# Patient Record
Sex: Female | Born: 1975 | Race: Black or African American | Hispanic: No | Smoking: Former smoker
Health system: Southern US, Community
[De-identification: ages and names within clinical notes are randomized; demographics above are authoritative.]

## PROBLEM LIST (undated history)

## (undated) DIAGNOSIS — IMO0002 Reserved for concepts with insufficient information to code with codable children: Secondary | ICD-10-CM

## (undated) DIAGNOSIS — F329 Major depressive disorder, single episode, unspecified: Secondary | ICD-10-CM

## (undated) DIAGNOSIS — Z789 Other specified health status: Secondary | ICD-10-CM

## (undated) DIAGNOSIS — D649 Anemia, unspecified: Secondary | ICD-10-CM

## (undated) DIAGNOSIS — A749 Chlamydial infection, unspecified: Secondary | ICD-10-CM

## (undated) DIAGNOSIS — F32A Depression, unspecified: Secondary | ICD-10-CM

## (undated) DIAGNOSIS — N760 Acute vaginitis: Secondary | ICD-10-CM

## (undated) DIAGNOSIS — B9689 Other specified bacterial agents as the cause of diseases classified elsewhere: Secondary | ICD-10-CM

## (undated) DIAGNOSIS — R87619 Unspecified abnormal cytological findings in specimens from cervix uteri: Secondary | ICD-10-CM

## (undated) HISTORY — PX: NO PAST SURGERIES: SHX2092

---

## 2007-03-11 ENCOUNTER — Ambulatory Visit (HOSPITAL_COMMUNITY): Admission: RE | Admit: 2007-03-11 | Discharge: 2007-03-11 | Payer: Self-pay | Admitting: Family Medicine

## 2007-03-20 ENCOUNTER — Emergency Department (HOSPITAL_COMMUNITY): Admission: EM | Admit: 2007-03-20 | Discharge: 2007-03-20 | Payer: Self-pay | Admitting: Emergency Medicine

## 2007-05-06 ENCOUNTER — Ambulatory Visit: Payer: Self-pay | Admitting: Physician Assistant

## 2007-05-06 ENCOUNTER — Inpatient Hospital Stay (HOSPITAL_COMMUNITY): Admission: AD | Admit: 2007-05-06 | Discharge: 2007-05-08 | Payer: Self-pay | Admitting: Obstetrics & Gynecology

## 2008-05-26 ENCOUNTER — Inpatient Hospital Stay (HOSPITAL_COMMUNITY): Admission: AD | Admit: 2008-05-26 | Discharge: 2008-05-28 | Payer: Self-pay | Admitting: Obstetrics and Gynecology

## 2008-05-26 ENCOUNTER — Ambulatory Visit: Payer: Self-pay | Admitting: Advanced Practice Midwife

## 2008-11-08 IMAGING — US US OB COMP +14 WK
1 series · 2 of 2 positions shown · non-contrast
Comparison: none

OBSTETRICAL ULTRASOUND:

 This ultrasound exam was performed in the [HOSPITAL] Ultrasound Department.  The OB US report was generated in the AS system, and faxed to the ordering physician.  This report is also available in [REDACTED] PACS.

[Series 1: us ob comp +14 wk · 0.30mm/px · 2 of 2 slices shown]
[im 1/2]
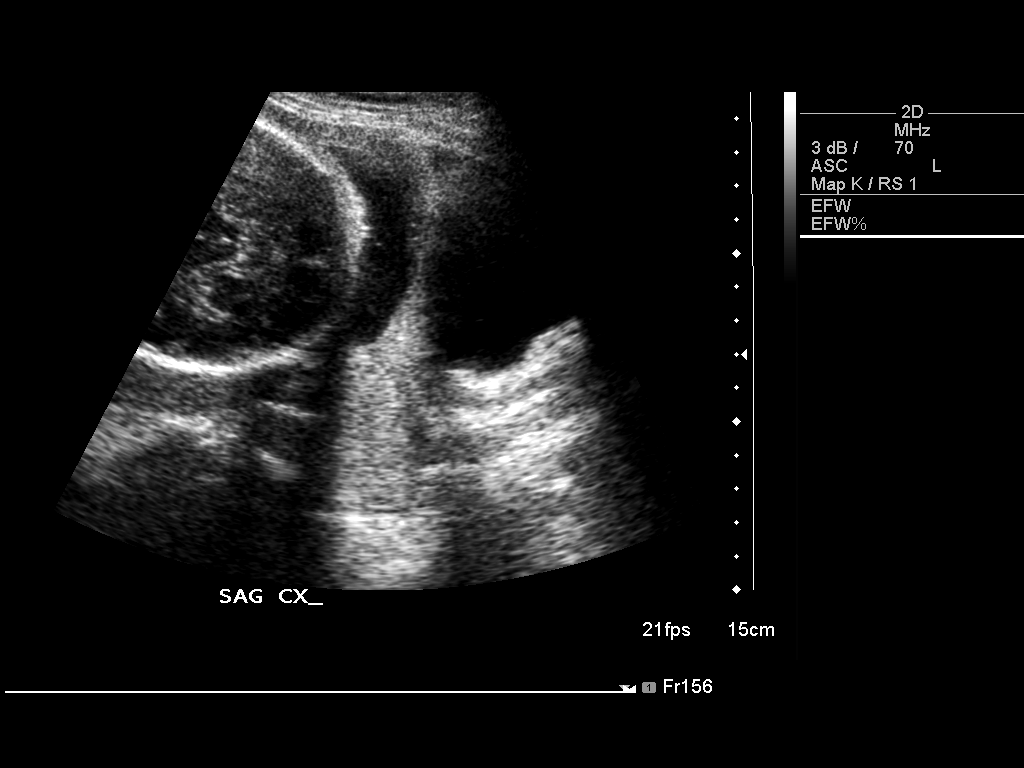
[im 2/2]
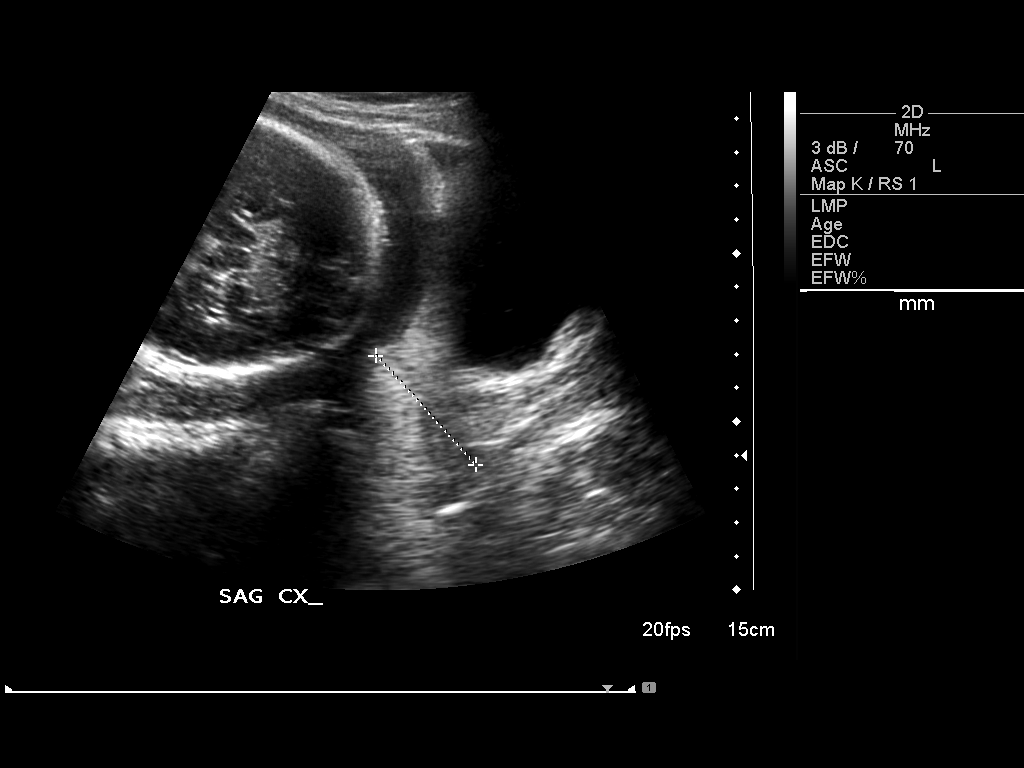

[2 of 2 positions shown; findings below may reference images not displayed]

IMPRESSION: See AS Obstetric US report.

## 2010-04-27 NOTE — L&D Delivery Note (Cosign Needed)
Delivery Note At 12:12 PM a viable female was delivered via Vaginal, Spontaneous Delivery (Presentation: ; Occiput Anterior).  APGAR: 9, 9; weight 6 lb 3.1 oz (2809 g).   Placenta status: Intact, Spontaneous.  Cord: 3 vessels with the following complications: None.    Anesthesia: Epidural  Episiotomy: None Lacerations: None Suture Repair: n/a Est. Blood Loss (mL): 250  Mom to postpartum.  Baby to nursery-stable.  Maren Reamer, Janautica Netzley 04/14/2011, 12:36 PM

## 2010-08-11 LAB — STREP B DNA PROBE: Strep Group B Ag: NEGATIVE

## 2010-08-11 LAB — WET PREP, GENITAL
Clue Cells Wet Prep HPF POC: NONE SEEN
Trich, Wet Prep: NONE SEEN

## 2010-08-11 LAB — URINALYSIS, ROUTINE W REFLEX MICROSCOPIC
Bilirubin Urine: NEGATIVE
Glucose, UA: NEGATIVE mg/dL
Hgb urine dipstick: NEGATIVE
Ketones, ur: NEGATIVE mg/dL
Nitrite: NEGATIVE
Protein, ur: NEGATIVE mg/dL
Urobilinogen, UA: 0.2 mg/dL (ref 0.0–1.0)
pH: 6.5 (ref 5.0–8.0)

## 2010-08-11 LAB — RUBELLA SCREEN: Rubella: 99 IU/mL — ABNORMAL HIGH

## 2010-08-11 LAB — GC/CHLAMYDIA PROBE AMP, GENITAL
Chlamydia, DNA Probe: POSITIVE — AB
GC Probe Amp, Genital: NEGATIVE

## 2010-08-11 LAB — RAPID URINE DRUG SCREEN, HOSP PERFORMED
Amphetamines: NOT DETECTED
Barbiturates: NOT DETECTED
Benzodiazepines: NOT DETECTED
Cocaine: NOT DETECTED

## 2010-08-11 LAB — URINE MICROSCOPIC-ADD ON

## 2010-08-11 LAB — CBC: RBC: 4.27 MIL/uL (ref 3.87–5.11)

## 2010-08-11 LAB — RAPID HIV SCREEN (WH-MAU): Rapid HIV Screen: NONREACTIVE

## 2011-01-15 LAB — URIC ACID: Uric Acid, Serum: 5.3

## 2011-01-15 LAB — CBC
Hemoglobin: 12.2
RBC: 4.17

## 2011-01-24 ENCOUNTER — Inpatient Hospital Stay (HOSPITAL_COMMUNITY)
Admission: AD | Admit: 2011-01-24 | Discharge: 2011-01-24 | Disposition: A | Discharge status: Home / Self Care | Payer: Self-pay | Source: Ambulatory Visit | Attending: Obstetrics & Gynecology | Admitting: Obstetrics & Gynecology

## 2011-01-24 ENCOUNTER — Other Ambulatory Visit: Payer: Self-pay | Admitting: Emergency Medicine

## 2011-01-24 ENCOUNTER — Encounter (HOSPITAL_COMMUNITY): Payer: Self-pay | Admitting: *Deleted

## 2011-01-24 DIAGNOSIS — B9689 Other specified bacterial agents as the cause of diseases classified elsewhere: Secondary | ICD-10-CM

## 2011-01-24 DIAGNOSIS — N760 Acute vaginitis: Secondary | ICD-10-CM | POA: Insufficient documentation

## 2011-01-24 DIAGNOSIS — A499 Bacterial infection, unspecified: Secondary | ICD-10-CM | POA: Insufficient documentation

## 2011-01-24 DIAGNOSIS — Z3480 Encounter for supervision of other normal pregnancy, unspecified trimester: Secondary | ICD-10-CM

## 2011-01-24 DIAGNOSIS — O2390 Unspecified genitourinary tract infection in pregnancy, unspecified trimester: Secondary | ICD-10-CM | POA: Insufficient documentation

## 2011-01-24 DIAGNOSIS — A749 Chlamydial infection, unspecified: Secondary | ICD-10-CM

## 2011-01-24 DIAGNOSIS — O4700 False labor before 37 completed weeks of gestation, unspecified trimester: Secondary | ICD-10-CM | POA: Insufficient documentation

## 2011-01-24 HISTORY — DX: Chlamydial infection, unspecified: A74.9

## 2011-01-24 HISTORY — DX: Other specified bacterial agents as the cause of diseases classified elsewhere: B96.89

## 2011-01-24 HISTORY — DX: Other specified health status: Z78.9

## 2011-01-24 HISTORY — DX: Other specified bacterial agents as the cause of diseases classified elsewhere: N76.0

## 2011-01-24 LAB — URINE MICROSCOPIC-ADD ON

## 2011-01-24 LAB — URINALYSIS, ROUTINE W REFLEX MICROSCOPIC
Bilirubin Urine: NEGATIVE
Hgb urine dipstick: NEGATIVE
Ketones, ur: >80 mg/dL — AB
Protein, ur: NEGATIVE mg/dL
Specific Gravity, Urine: 1.02 (ref 1.005–1.030)

## 2011-01-24 LAB — WET PREP, GENITAL: Yeast Wet Prep HPF POC: NONE SEEN

## 2011-01-24 MED ORDER — METRONIDAZOLE 500 MG PO TABS: 500.0000 mg | ORAL_TABLET | Freq: Two times a day (BID) | ORAL | Status: AC

## 2011-01-24 NOTE — Progress Notes (Signed)
Dr Elwyn Reach notified of pt's admission and status. Will seee pt in MAU

## 2011-01-24 NOTE — Progress Notes (Signed)
Dr Elwyn Reach in to see pt. Spec exam done and wet prep and GC/Chlam obtained. Pt tol well

## 2011-01-24 NOTE — Progress Notes (Signed)
Pt states, " I started having pressure in my low abdomen and low back at 9 pm. I went in to work at 10 pm, but they steadily got worse and sometimes it felt ljke contractions."

## 2011-01-24 NOTE — Progress Notes (Signed)
Written and verbal d/c instructions given and understanding voiced. Wants to try to get appt with private MD. List of OB Mds given. To call [facility] Monday for appt since started with [facility] until finds out if priv. MD will see her this late in preg.

## 2011-01-24 NOTE — ED Provider Notes (Signed)
Agree 

## 2011-01-24 NOTE — Progress Notes (Signed)
Dr Elwyn Reach in to see pt and discuss lab results.

## 2011-01-24 NOTE — Progress Notes (Signed)
G9P8 at ?30.2wks. Pt states has had 2 Prenatal visits to health dept. Last evening started having pain in lower abd. Like balling up in abdomen. Went on to work and pain got worse. Having some pain RLQ which is worse with walking. Having constant menstrual like cramps in lower back.

## 2011-01-24 NOTE — ED Provider Notes (Signed)
History     Chief Complaint  Patient presents with  . Contractions  . Back Pain   HPI Ms. Gutterman is a 35 year old [id number] at [redacted]w[redacted]d by LMP who presents for contractions and back pain.  The pain started around 9pm and has been getting more constant since then.  Currently feels some crampy abdominal pain and occasionally tightening.  Also complaining of constant back pain.  Denies loss of fluid and vaginal bleeding.  Reports good fetal movement.  Denies dysuria, vaginal discharge, vaginal itching. Reports good PO intake today.  Did state had a yeast infection 2 weeks ago that she treated with an OTC cream.  Has seen the HD a few times during this pregnancy, but having difficultly scheduling appointments.  States has not had any blood work or ultrasounds.  Denies any problems with the pregnancy.  OB History    Grav Para Term Preterm Abortions TAB SAB Ect Mult Living   9 8 6 2  0 0 0 0 0 7      Past Medical History  Diagnosis Date  . No pertinent past medical history     Past Surgical History  Procedure Date  . No past surgeries     No family history on file. No history of HTN or DM.  History  Substance Use Topics  . Smoking status: Never Smoker   . Smokeless tobacco: Not on file  . Alcohol Use:      smoked marijuana 2 wks ago    Allergies: No Known Allergies  Prescriptions prior to admission  Medication Sig Dispense Refill  . acetaminophen (TYLENOL) 500 MG tablet Take 500 mg by mouth every 6 (six) hours as needed. For pain       . Calcium Carbonate Antacid (TUMS PO) Take 1 tablet by mouth daily.        . prenatal vitamin w/FE, FA (PRENATAL 1 + 1) 27-1 MG TABS Take 1 tablet by mouth daily.          Review of Systems  Constitutional: Negative for fever and chills.  Eyes: Negative for blurred vision.  Respiratory: Negative for shortness of breath.   Cardiovascular: Negative for palpitations.  Gastrointestinal: Positive for abdominal pain (see HPI). Negative for nausea,  vomiting, diarrhea and constipation.  Genitourinary: Negative for dysuria.  Skin: Negative for rash.  Neurological: Negative for dizziness and headaches.   Physical Exam   Blood pressure 122/78, pulse 93, temperature 98.5 F (36.9 C), temperature source Oral, height 5\' 4"  (1.626 m), weight 180 lb (81.647 kg), last menstrual period 06/26/2010.  Physical Exam  Constitutional: She is oriented to person, place, and time. She appears well-developed and well-nourished. No distress.  HENT:  Head: Normocephalic and atraumatic.  Mouth/Throat: Oropharynx is clear and moist.  Eyes: No scleral icterus.  Neck: Normal range of motion.  Cardiovascular: Normal rate, regular rhythm, normal heart sounds and intact distal pulses.  Exam reveals no gallop.   No murmur heard. Respiratory: Effort normal. She has no wheezes. She has no rales.  GI: Bowel sounds are normal. There is no tenderness.       Gravid, fundus above umbilicus  Genitourinary:       External genitalia normal.  Normal vagina, small amount of yellowish white discharge present. Cervix visualized, appears normal.  Musculoskeletal: She exhibits no edema and no tenderness.  Neurological: She is alert and oriented to person, place, and time.  Skin: Skin is warm and dry. No rash noted.  Psychiatric: She has a  normal mood and affect. Her behavior is normal.  Dilation: Fingertip Effacement (%): Thick Exam by:: Dr Elwyn Reach  [facility]: 140s, minimal to moderate variability, difficult to trace due to fetal movement, no accels or decels noted  MAU Course  Procedures Urine dipstick shows positive for ketones, small LE.  Micro exam: 11-20 WBC's per HPF and contaminated specimen. Wet Prep: moderate clue cells, WBCs GC/Chlamydia collected   Assessment and Plan  35 year old [id number] at 31.5 by LMP with limited prenatal care presenting with braxton hicks contractions -emphasized importance of following up with the health department -ordered an outpatient  ultrasound; pt to call Monday to schedule, [phone] -BV: flagyl 500mg  BID x7 days -discharge home with preterm labor precautions  BOOTH, Sherrine Salberg 01/24/2011, 3:30 AM

## 2011-01-26 LAB — GC/CHLAMYDIA PROBE AMP, GENITAL
Chlamydia, DNA Probe: POSITIVE — AB
GC Probe Amp, Genital: NEGATIVE

## 2011-02-03 LAB — BASIC METABOLIC PANEL
BUN: 4 — ABNORMAL LOW
Calcium: 8.7
Chloride: 104
Creatinine, Ser: 0.47
GFR calc Af Amer: >60

## 2011-02-03 LAB — RAPID URINE DRUG SCREEN, HOSP PERFORMED
Amphetamines: NOT DETECTED
Barbiturates: NOT DETECTED
Benzodiazepines: NOT DETECTED
Tetrahydrocannabinol: POSITIVE — AB

## 2011-02-03 LAB — CBC
MCHC: 33.3
MCV: 87.4
Platelets: 206
WBC: 8.2

## 2011-02-03 LAB — DIFFERENTIAL
Basophils Relative: 0
Eosinophils Absolute: 0.3
Neutro Abs: 6.4
Neutrophils Relative %: 78 — ABNORMAL HIGH

## 2011-02-03 LAB — ETHANOL: Alcohol, Ethyl (B): <5

## 2011-02-11 ENCOUNTER — Other Ambulatory Visit (HOSPITAL_COMMUNITY): Payer: Self-pay | Admitting: Family

## 2011-02-11 DIAGNOSIS — Z3689 Encounter for other specified antenatal screening: Secondary | ICD-10-CM

## 2011-02-11 LAB — ABO/RH: RH Type: POSITIVE

## 2011-02-11 LAB — RPR: RPR: NONREACTIVE

## 2011-02-13 ENCOUNTER — Ambulatory Visit (HOSPITAL_COMMUNITY)
Admission: RE | Admit: 2011-02-13 | Discharge: 2011-02-13 | Disposition: A | Discharge status: Home / Self Care | Payer: Medicaid Other | Source: Ambulatory Visit | Attending: Family | Admitting: Family

## 2011-02-13 DIAGNOSIS — Z3689 Encounter for other specified antenatal screening: Secondary | ICD-10-CM

## 2011-02-13 DIAGNOSIS — O358XX0 Maternal care for other (suspected) fetal abnormality and damage, not applicable or unspecified: Secondary | ICD-10-CM | POA: Insufficient documentation

## 2011-02-13 DIAGNOSIS — Z363 Encounter for antenatal screening for malformations: Secondary | ICD-10-CM | POA: Insufficient documentation

## 2011-02-13 DIAGNOSIS — O0930 Supervision of pregnancy with insufficient antenatal care, unspecified trimester: Secondary | ICD-10-CM | POA: Insufficient documentation

## 2011-02-13 DIAGNOSIS — Z1389 Encounter for screening for other disorder: Secondary | ICD-10-CM | POA: Insufficient documentation

## 2011-03-26 ENCOUNTER — Other Ambulatory Visit (HOSPITAL_COMMUNITY): Payer: Self-pay | Admitting: Physician Assistant

## 2011-03-31 ENCOUNTER — Ambulatory Visit (HOSPITAL_COMMUNITY)
Admission: RE | Admit: 2011-03-31 | Discharge: 2011-03-31 | Disposition: A | Discharge status: Home / Self Care | Payer: Medicaid Other | Source: Ambulatory Visit | Attending: Physician Assistant | Admitting: Physician Assistant

## 2011-03-31 DIAGNOSIS — O0930 Supervision of pregnancy with insufficient antenatal care, unspecified trimester: Secondary | ICD-10-CM | POA: Insufficient documentation

## 2011-03-31 DIAGNOSIS — O3660X0 Maternal care for excessive fetal growth, unspecified trimester, not applicable or unspecified: Secondary | ICD-10-CM | POA: Insufficient documentation

## 2011-04-13 ENCOUNTER — Encounter (HOSPITAL_COMMUNITY): Payer: Self-pay | Admitting: *Deleted

## 2011-04-13 ENCOUNTER — Inpatient Hospital Stay (HOSPITAL_COMMUNITY)
Admission: AD | Admit: 2011-04-13 | Discharge: 2011-04-15 | DRG: 775 | Disposition: A | Discharge status: Home / Self Care | Payer: Medicaid Other | Source: Ambulatory Visit | Attending: Obstetrics & Gynecology | Admitting: Obstetrics & Gynecology

## 2011-04-13 DIAGNOSIS — O99892 Other specified diseases and conditions complicating childbirth: Secondary | ICD-10-CM | POA: Diagnosis present

## 2011-04-13 DIAGNOSIS — O4290 Premature rupture of membranes, unspecified as to length of time between rupture and onset of labor, unspecified weeks of gestation: Principal | ICD-10-CM | POA: Diagnosis present

## 2011-04-13 DIAGNOSIS — O09529 Supervision of elderly multigravida, unspecified trimester: Secondary | ICD-10-CM | POA: Diagnosis present

## 2011-04-13 DIAGNOSIS — Z22330 Carrier of Group B streptococcus: Secondary | ICD-10-CM

## 2011-04-13 HISTORY — DX: Other specified bacterial agents as the cause of diseases classified elsewhere: B96.89

## 2011-04-13 HISTORY — DX: Unspecified abnormal cytological findings in specimens from cervix uteri: R87.619

## 2011-04-13 HISTORY — DX: Acute vaginitis: N76.0

## 2011-04-13 HISTORY — DX: Depression, unspecified: F32.A

## 2011-04-13 HISTORY — DX: Chlamydial infection, unspecified: A74.9

## 2011-04-13 HISTORY — DX: Reserved for concepts with insufficient information to code with codable children: IMO0002

## 2011-04-13 HISTORY — DX: Major depressive disorder, single episode, unspecified: F32.9

## 2011-04-13 HISTORY — DX: Anemia, unspecified: D64.9

## 2011-04-13 NOTE — Progress Notes (Signed)
Pt felt ctx and a little gush of water @ 2215, then one hour later more watery fluid ran down her leg.

## 2011-04-14 ENCOUNTER — Encounter (HOSPITAL_COMMUNITY): Payer: Self-pay | Admitting: *Deleted

## 2011-04-14 ENCOUNTER — Encounter (HOSPITAL_COMMUNITY): Payer: Self-pay | Admitting: Anesthesiology

## 2011-04-14 ENCOUNTER — Inpatient Hospital Stay (HOSPITAL_COMMUNITY): Payer: Medicaid Other | Admitting: Anesthesiology

## 2011-04-14 DIAGNOSIS — O9989 Other specified diseases and conditions complicating pregnancy, childbirth and the puerperium: Secondary | ICD-10-CM

## 2011-04-14 DIAGNOSIS — O4290 Premature rupture of membranes, unspecified as to length of time between rupture and onset of labor, unspecified weeks of gestation: Secondary | ICD-10-CM

## 2011-04-14 DIAGNOSIS — O09529 Supervision of elderly multigravida, unspecified trimester: Secondary | ICD-10-CM

## 2011-04-14 LAB — RAPID URINE DRUG SCREEN, HOSP PERFORMED
Amphetamines: NOT DETECTED
Barbiturates: NOT DETECTED
Benzodiazepines: NOT DETECTED
Cocaine: NOT DETECTED

## 2011-04-14 LAB — CBC
HCT: 33.8 % — ABNORMAL LOW (ref 36.0–46.0)
MCH: 26.9 pg (ref 26.0–34.0)
MCV: 83.5 fL (ref 78.0–100.0)
Platelets: 166 10*3/uL (ref 150–400)
RDW: 14.9 % (ref 11.5–15.5)
WBC: 10.2 10*3/uL (ref 4.0–10.5)

## 2011-04-14 LAB — POCT FERN TEST
Fern Test: NEGATIVE
Fern Test: POSITIVE

## 2011-04-14 MED ORDER — ACETAMINOPHEN 325 MG PO TABS: 650.0000 mg | ORAL_TABLET | ORAL | Status: DC | PRN

## 2011-04-14 MED ORDER — DIPHENHYDRAMINE HCL 25 MG PO CAPS: 25.0000 mg | ORAL_CAPSULE | Freq: Four times a day (QID) | ORAL | Status: DC | PRN

## 2011-04-14 MED ORDER — SIMETHICONE 80 MG PO CHEW: 80.0000 mg | CHEWABLE_TABLET | ORAL | Status: DC | PRN

## 2011-04-14 MED ORDER — LIDOCAINE HCL (PF) 1 % IJ SOLN: 30.0000 mL | INTRAMUSCULAR | Status: DC | PRN

## 2011-04-14 MED ORDER — IBUPROFEN 600 MG PO TABS: 600.0000 mg | ORAL_TABLET | Freq: Four times a day (QID) | ORAL | Status: DC | PRN

## 2011-04-14 MED ORDER — DIPHENHYDRAMINE HCL 50 MG/ML IJ SOLN: 12.5000 mg | INTRAMUSCULAR | Status: DC | PRN

## 2011-04-14 MED ORDER — OXYTOCIN 20 UNITS IN LACTATED RINGERS INFUSION - SIMPLE
1.0000 m[IU]/min | INTRAVENOUS | Status: DC
  Administered 2011-04-14: 333 m[IU]/min via INTRAVENOUS
  Administered 2011-04-14: 2 m[IU]/min via INTRAVENOUS
  Filled 2011-04-14: qty 1000

## 2011-04-14 MED ORDER — ONDANSETRON HCL 4 MG PO TABS: 4.0000 mg | ORAL_TABLET | ORAL | Status: DC | PRN

## 2011-04-14 MED ORDER — OXYCODONE-ACETAMINOPHEN 5-325 MG PO TABS: 2.0000 | ORAL_TABLET | ORAL | Status: DC | PRN

## 2011-04-14 MED ORDER — NALBUPHINE SYRINGE 5 MG/0.5 ML: 10.0000 mg | INJECTION | INTRAMUSCULAR | Status: DC | PRN

## 2011-04-14 MED ORDER — EPHEDRINE 5 MG/ML INJ
10.0000 mg | INTRAVENOUS | Status: DC | PRN
  Filled 2011-04-14: qty 4

## 2011-04-14 MED ORDER — ONDANSETRON HCL 4 MG/2ML IJ SOLN: 4.0000 mg | Freq: Four times a day (QID) | INTRAMUSCULAR | Status: DC | PRN

## 2011-04-14 MED ORDER — IBUPROFEN 600 MG PO TABS
600.0000 mg | ORAL_TABLET | Freq: Four times a day (QID) | ORAL | Status: DC
  Administered 2011-04-14 – 2011-04-15 (×4): 600 mg via ORAL
  Filled 2011-04-14 (×4): qty 1

## 2011-04-14 MED ORDER — TETANUS-DIPHTH-ACELL PERTUSSIS 5-2.5-18.5 LF-MCG/0.5 IM SUSP: 0.5000 mL | Freq: Once | INTRAMUSCULAR | Status: DC

## 2011-04-14 MED ORDER — LANOLIN HYDROUS EX OINT: TOPICAL_OINTMENT | CUTANEOUS | Status: DC | PRN

## 2011-04-14 MED ORDER — BENZOCAINE-MENTHOL 20-0.5 % EX AERO: 1.0000 "application " | INHALATION_SPRAY | CUTANEOUS | Status: DC | PRN

## 2011-04-14 MED ORDER — LACTATED RINGERS IV SOLN: 500.0000 mL | INTRAVENOUS | Status: DC | PRN

## 2011-04-14 MED ORDER — DIBUCAINE 1 % RE OINT: 1.0000 "application " | TOPICAL_OINTMENT | RECTAL | Status: DC | PRN

## 2011-04-14 MED ORDER — OXYCODONE-ACETAMINOPHEN 5-325 MG PO TABS: 1.0000 | ORAL_TABLET | ORAL | Status: DC | PRN

## 2011-04-14 MED ORDER — OXYTOCIN BOLUS FROM INFUSION
500.0000 mL | Freq: Once | INTRAVENOUS | Status: DC
  Filled 2011-04-14: qty 500

## 2011-04-14 MED ORDER — CITRIC ACID-SODIUM CITRATE 334-500 MG/5ML PO SOLN: 30.0000 mL | ORAL | Status: DC | PRN

## 2011-04-14 MED ORDER — TERBUTALINE SULFATE 1 MG/ML IJ SOLN: 0.2500 mg | Freq: Once | INTRAMUSCULAR | Status: DC | PRN

## 2011-04-14 MED ORDER — ONDANSETRON HCL 4 MG/2ML IJ SOLN: 4.0000 mg | INTRAMUSCULAR | Status: DC | PRN

## 2011-04-14 MED ORDER — PENICILLIN G POTASSIUM 5000000 UNITS IJ SOLR
2.5000 10*6.[IU] | INTRAMUSCULAR | Status: DC
  Administered 2011-04-14 (×2): 2.5 10*6.[IU] via INTRAVENOUS
  Filled 2011-04-14 (×6): qty 2.5

## 2011-04-14 MED ORDER — LACTATED RINGERS IV SOLN
INTRAVENOUS | Status: DC
  Administered 2011-04-14 (×2): via INTRAVENOUS

## 2011-04-14 MED ORDER — PHENYLEPHRINE 40 MCG/ML (10ML) SYRINGE FOR IV PUSH (FOR BLOOD PRESSURE SUPPORT): 80.0000 ug | PREFILLED_SYRINGE | INTRAVENOUS | Status: DC | PRN

## 2011-04-14 MED ORDER — ZOLPIDEM TARTRATE 5 MG PO TABS: 5.0000 mg | ORAL_TABLET | Freq: Every evening | ORAL | Status: DC | PRN

## 2011-04-14 MED ORDER — PHENYLEPHRINE 40 MCG/ML (10ML) SYRINGE FOR IV PUSH (FOR BLOOD PRESSURE SUPPORT)
80.0000 ug | PREFILLED_SYRINGE | INTRAVENOUS | Status: DC | PRN
  Filled 2011-04-14: qty 5

## 2011-04-14 MED ORDER — LACTATED RINGERS IV SOLN
500.0000 mL | Freq: Once | INTRAVENOUS | Status: AC
  Administered 2011-04-14: 1000 mL via INTRAVENOUS

## 2011-04-14 MED ORDER — SENNOSIDES-DOCUSATE SODIUM 8.6-50 MG PO TABS
2.0000 | ORAL_TABLET | Freq: Every day | ORAL | Status: DC
  Administered 2011-04-14: 2 via ORAL

## 2011-04-14 MED ORDER — WITCH HAZEL-GLYCERIN EX PADS: 1.0000 "application " | MEDICATED_PAD | CUTANEOUS | Status: DC | PRN

## 2011-04-14 MED ORDER — BENZOCAINE-MENTHOL 20-0.5 % EX AERO
INHALATION_SPRAY | CUTANEOUS | Status: AC
  Administered 2011-04-14: 16:00:00
  Filled 2011-04-14: qty 56

## 2011-04-14 MED ORDER — PRENATAL MULTIVITAMIN CH
1.0000 | ORAL_TABLET | Freq: Every day | ORAL | Status: DC
  Administered 2011-04-15: 1 via ORAL
  Filled 2011-04-14: qty 1

## 2011-04-14 MED ORDER — EPHEDRINE 5 MG/ML INJ: 10.0000 mg | INTRAVENOUS | Status: DC | PRN

## 2011-04-14 MED ORDER — PENICILLIN G POTASSIUM 5000000 UNITS IJ SOLR
5.0000 10*6.[IU] | Freq: Once | INTRAVENOUS | Status: AC
  Administered 2011-04-14: 5 10*6.[IU] via INTRAVENOUS
  Filled 2011-04-14: qty 5

## 2011-04-14 MED ORDER — LIDOCAINE HCL 1.5 % IJ SOLN
INTRAMUSCULAR | Status: DC | PRN
  Administered 2011-04-14: 2 mL via INTRADERMAL
  Administered 2011-04-14 (×2): 5 mL via INTRADERMAL

## 2011-04-14 MED ORDER — FENTANYL 2.5 MCG/ML BUPIVACAINE 1/10 % EPIDURAL INFUSION (WH - ANES)
14.0000 mL/h | INTRAMUSCULAR | Status: DC
  Administered 2011-04-14 (×2): 14 mL/h via EPIDURAL
  Filled 2011-04-14 (×2): qty 60

## 2011-04-14 MED ORDER — PRENATAL PLUS 27-1 MG PO TABS: 1.0000 | ORAL_TABLET | Freq: Every day | ORAL | Status: DC

## 2011-04-14 MED ORDER — OXYTOCIN 20 UNITS IN LACTATED RINGERS INFUSION - SIMPLE: 125.0000 mL/h | Freq: Once | INTRAVENOUS | Status: DC

## 2011-04-14 MED ORDER — FLEET ENEMA 7-19 GM/118ML RE ENEM: 1.0000 | ENEMA | RECTAL | Status: DC | PRN

## 2011-04-14 NOTE — Progress Notes (Signed)
Mercedes Best is a 35 y.o. [id number] at [redacted]w[redacted]d. Subjective: Contractions closer and stronger  Objective: BP 120/81  Pulse 96  Temp(Src) 98.8 F (37.1 C) (Oral)  Resp 20  LMP 06/26/2010      FHT:  FHR: 125 bpm, variability: moderate,  accelerations:  Present,  decelerations:  Absent UC:   irregular, mild-mod SVE:   Deferred  Labs: Lab Results  Component Value Date   WBC 10.2 04/14/2011   HGB 10.9* 04/14/2011   HCT 33.8* 04/14/2011   MCV 83.5 04/14/2011   PLT 166 04/14/2011    Assessment / Plan: PROM, early labor  Labor: Progressing normally Preeclampsia:  NA Fetal Wellbeing:  Category I Pain Control:  Labor support without medications I/D:  n/a Anticipated MOD:  NSVD  Kare Dado 04/14/2011, 7:17 AM

## 2011-04-14 NOTE — Progress Notes (Signed)
Mercedes Best is a 35 y.o. [id number] at [redacted]w[redacted]d admitted for PROM.   Subjective: Rcvd epidural about an hour ago.   Is comfortable, but reports she is still able to feel her ctx.  Ctx have spaced out some since getting the epidural: q10-12.   Objective: BP 118/74  Pulse 91  Temp(Src) 98.5 F (36.9 C) (Oral)  Resp 18  LMP 06/26/2010      FHT:  FHR: 125 bpm, variability: moderate,  accelerations:  Present,  decelerations:  Present early decels  UC:   irregular, every 10-12 minutes SVE:   Dilation: 3.5 Effacement (%): 80 Station: -2 Exam by:: Currie Paris, RN  Labs: Lab Results  Component Value Date   WBC 10.2 04/14/2011   HGB 10.9* 04/14/2011   HCT 33.8* 04/14/2011   MCV 83.5 04/14/2011   PLT 166 04/14/2011    Assessment / Plan: Protracted latent phase  Labor: will stat pitocin Preeclampsia:  N/a  Fetal Wellbeing:  Category I Pain Control:  Epidural I/D:  n/a Anticipated MOD:  NSVD  Discussed with Donzetta Kohut.   Maren Reamer, Brandee Markin 04/14/2011, 9:26 AM

## 2011-04-14 NOTE — Anesthesia Preprocedure Evaluation (Signed)
Anesthesia Evaluation  Patient identified by MRN, date of birth, ID band Patient awake    Reviewed: Allergy & Precautions, H&P , NPO status , Patient's Chart, lab work & pertinent test results, reviewed documented beta blocker date and time   History of Anesthesia Complications Negative for: history of anesthetic complications  Airway Mallampati: I TM Distance: >3 FB Neck ROM: full    Dental  (+) Teeth Intact   Pulmonary neg pulmonary ROS,  clear to auscultation        Cardiovascular neg cardio ROS regular Normal    Neuro/Psych Negative Neurological ROS  Negative Psych ROS   GI/Hepatic negative GI ROS, Neg liver ROS,   Endo/Other  Negative Endocrine ROS  Renal/GU negative Renal ROS  Genitourinary negative   Musculoskeletal   Abdominal   Peds  Hematology negative hematology ROS (+)   Anesthesia Other Findings   Reproductive/Obstetrics (+) Pregnancy                           Anesthesia Physical Anesthesia Plan  ASA: II  Anesthesia Plan: Epidural   Post-op Pain Management:    Induction:   Airway Management Planned:   Additional Equipment:   Intra-op Plan:   Post-operative Plan:   Informed Consent: I have reviewed the patients History and Physical, chart, labs and discussed the procedure including the risks, benefits and alternatives for the proposed anesthesia with the patient or authorized representative who has indicated his/her understanding and acceptance.     Plan Discussed with:   Anesthesia Plan Comments:         Anesthesia Quick Evaluation  

## 2011-04-14 NOTE — H&P (Signed)
Mercedes Best 35 y.o. female  [id number] at 25.0 presenting with concern for rupture of membranes. Patient cared for by health department since around 30 weeks.  Patient says that at approximately 10pm tonight she noted a rush of fluid which she thought maybe was her own urine. An hour later she noted fluid dripping down her leg. She was not having any contractions although now reports contractions about every 30 minutes.   Maternal Medical History:  Reason for admission: Reason for admission: rupture of membranes.  Contractions: Onset was 1-2 hours ago.   Frequency: irregular.   Perceived severity is mild.    Fetal activity: Perceived fetal activity is normal.   Last perceived fetal movement was within the past hour.      OB History    Grav Para Term Preterm Abortions TAB SAB Ect Mult Living   9 8 6 2  0 0 0 0 0 7     Past Medical History  Diagnosis Date  . No pertinent past medical history    Past Surgical History  Procedure Date  . No past surgeries    Family History: family history includes Cancer in her mother. Social History:  reports that she quit smoking about 3 years ago. Her smoking use included Cigarettes. She has a 1.5 pack-year smoking history. She does not have any smokeless tobacco history on file. She reports that she uses illicit drugs (Marijuana). She reports that she does not drink alcohol.  ROS negative except as noted in HPI    Dilation: 1 (ext os 4cm) Effacement (%): 40 Station: -3 Exam by:: K. Weis sRN Blood pressure 136/77, pulse 89, resp. rate 18, last menstrual period 06/26/2010. Maternal Exam:  Uterine Assessment: Contraction strength is mild.  Contraction frequency is irregular.   Abdomen: Fundal height is consistent with dates. .   Estimated fetal weight is 54% by 37 week ultrasound..   Fetal presentation: vertex  Introitus: Normal vulva. Normal vagina.    Fetal Exam Fetal Monitor Review: Baseline rate: 130.  Variability: moderate (6-25  bpm).   Pattern: accelerations present and variable decelerations.    Fetal State Assessment: Category II - tracings are indeterminate.     Physical Exam  Constitutional: She is oriented to person, place, and time. She appears well-nourished. No distress.  Cardiovascular: Normal rate and regular rhythm.  Exam reveals no gallop and no friction rub.   No murmur heard. Respiratory: Breath sounds normal. No respiratory distress. She has no wheezes. She has no rales.  Genitourinary: Vagina normal and uterus normal.  Musculoskeletal: Normal range of motion.  Neurological: She is alert and oriented to person, place, and time.    Prenatal labs: ABO, Rh:   O+ Antibody:  negative Rubella:  immune RPR:   negative HBsAg:   negative HIV:   non reactive GBS:   negative  Assessment/Plan: #1 [id number] at 39.0 #2 PROM  Admit to L+D. Expectant management-patient's contractions appear to be increasing in frequency to about every 10 minutes now. Will monitor for cervical and contraction progression. Will augment if needed. No epidural at this time-will readdress once patient enters active phase.   Patient desires BTL and says has signed papers. Have not been able to locate at this point-may have to contact HD tomorrow.   Case discussed with Dorathy Kinsman, CNM  HUNTER, STEPHEN 04/14/2011, 1:23 AM

## 2011-04-14 NOTE — Anesthesia Procedure Notes (Signed)
Epidural Patient location during procedure: OB Start time: 04/14/2011 7:37 AM Reason for block: procedure for pain  Staffing Performed by: anesthesiologist   Preanesthetic Checklist Completed: patient identified, site marked, surgical consent, pre-op evaluation, timeout performed, IV checked, risks and benefits discussed and monitors and equipment checked  Epidural Patient position: sitting Prep: site prepped and draped and DuraPrep Patient monitoring: continuous pulse ox and blood pressure Approach: midline Injection technique: LOR air  Needle:  Needle type: Tuohy  Needle gauge: 17 G Needle length: 9 cm Catheter type: closed end flexible Catheter size: 19 Gauge Test dose: negative  Assessment Events: blood not aspirated, injection not painful, no injection resistance, negative IV test and no paresthesia  Additional Notes Discussed risk of headache, infection, bleeding, nerve injury and failed or incomplete block.  Patient voices understanding and wishes to proceed.

## 2011-04-14 NOTE — Progress Notes (Signed)
Mercedes Best is a 35 y.o. G2X5284 at [redacted]w[redacted]d admitted for PROM.    Subjective: Pt is very comfortable at this time, not feeling ctx.  No longer leaking fluid.   Objective: BP 106/61  Pulse 79  Temp(Src) 98 F (36.7 C) (Oral)  Resp 18  LMP 06/26/2010      FHT:  FHR: 130 bpm, variability: moderate,  accelerations:  Present,  decelerations:  Present early with every ctx, occasional variables.  UC:   irregular, every 4-5 minutes SVE:   Dilation: 4 Effacement (%): 90 Station: -1 Exam by:: Currie Paris, RN  Labs: Lab Results  Component Value Date   WBC 10.2 04/14/2011   HGB 10.9* 04/14/2011   HCT 33.8* 04/14/2011   MCV 83.5 04/14/2011   PLT 166 04/14/2011    Assessment / Plan: Augmentation of labor, progressing well Will cont to monitor.  May need amnioinfusion.   Labor: Progressing on Pitocin.   Preeclampsia:  n/a Fetal Wellbeing:  Category II Pain Control:  Epidural I/D:  n/a Anticipated MOD:  NSVD  O'Grady, Madalena Kesecker 04/14/2011, 11:39 AM

## 2011-04-14 NOTE — Progress Notes (Signed)
UR chart review completed.  

## 2011-04-14 NOTE — Progress Notes (Signed)
The CBG recorded at 1227 was done on the infant girl.  This value is not mom's CBG.

## 2011-04-15 MED ORDER — IBUPROFEN 600 MG PO TABS: 600.0000 mg | ORAL_TABLET | Freq: Four times a day (QID) | ORAL | Status: AC

## 2011-04-15 NOTE — Progress Notes (Signed)
Post Partum Day 1, SVD Subjective: no complaints, up ad lib, voiding, tolerating PO and + flatus  Objective: Blood pressure 108/71, pulse 83, temperature 98.2 F (36.8 C), temperature source Oral, resp. rate 18, last menstrual period 06/26/2010, unknown if currently breastfeeding.  Physical Exam:  General: alert, cooperative and no distress Lochia: appropriate Uterine Fundus: firm  DVT Evaluation: No cords or calf tenderness. No significant calf/ankle edema.   Basename 04/14/11 0124  HGB 10.9*  HCT 33.8*    Assessment/Plan: Plan for discharge tomorrow and Contraception BTL 6 weeks, bottle feeding.  Patient would like to go home today but she was GBS positive so will stay for baby and 48 hour watch.    LOS: 2 days   Mercedes Best 04/15/2011, 7:27 AM

## 2011-04-15 NOTE — Discharge Summary (Signed)
Obstetric Discharge Summary Reason for Admission: rupture of membranes Prenatal Procedures: none Intrapartum Procedures: spontaneous vaginal delivery Postpartum Procedures: none Complications-Operative and Postpartum: none Hemoglobin  Date Value Range Status  04/14/2011 10.9* 12.0-15.0 (g/dL) Final     HCT  Date Value Range Status  04/14/2011 33.8* 36.0-46.0 (%) Final    Discharge Diagnoses: Term Pregnancy-delivered  Discharge Information: Date: 04/15/2011 Activity: pelvic rest Diet: routine Medications: PNV and Ibuprofen Condition: stable Instructions: refer to practice specific booklet Discharge to: home Follow-up Information    Follow up with [facility] on 05/07/2011.   Contact information:   [address] [place] [address]          Newborn Data: Live born female  Birth Weight: 6 lb 3.1 oz (2809 g) APGAR: 9, 9  Home with mother.  Drake Wuertz JEHIEL 04/15/2011, 2:44 PM

## 2011-04-15 NOTE — Progress Notes (Signed)
PSYCHOSOCIAL ASSESSMENT ~ MATERNAL/CHILD Name:    Mercedes Best                                                                                   Age: 35  Referral Date:        12/19/ 12  Reason/Source: History of MJ use / CN  I. FAMILY/HOME ENVIRONMENT A. Child's Legal Guardian _X__Parent(s) ___Grandparent ___Foster parent ___DSS_________________ Name: Mercedes Best                               DOB: //                     Age: 35  Address: [address] ; [place], [place] [address]  Name: Mercedes Best                                DOB: //                     Age: 30  Address:  B. Other Household Members/Support Persons Name:                                         Relationship: daughter       DOB 08/21/98                   Name:                                         Relationship:  son              DOB 09/15/03                   Name:                                         Relationship:   son             DOB 05/06/07                   Name:                                         Relationship:  daughter      DOB 05/26/08  C. Other Support:   II. PSYCHOSOCIAL DATA A. Information Source                                                                                             

## 2011-04-15 NOTE — Anesthesia Postprocedure Evaluation (Signed)
  Anesthesia Post-op Note  Patient: Mercedes Best  Procedure(s) Performed: * No procedures listed *  Patient Location: Women's Unit  Anesthesia Type: Epidural  Level of Consciousness: alert  and oriented  Airway and Oxygen Therapy: Patient Spontanous Breathing  Post-op Pain: mild  Post-op Assessment: Post-op Vital signs reviewed  Post-op Vital Signs: stable  Complications: No apparent anesthesia complications

## 2011-05-07 ENCOUNTER — Ambulatory Visit: Payer: Medicaid Other | Admitting: Obstetrics & Gynecology

## 2012-10-13 IMAGING — US US OB DETAIL+14 WK
2 series · 12 of 28 positions shown · non-contrast
Comparison: none

[Series 1: us ob detail +14 wk · 10 of 87 slices shown (1 of 2)]
[im 4/87]
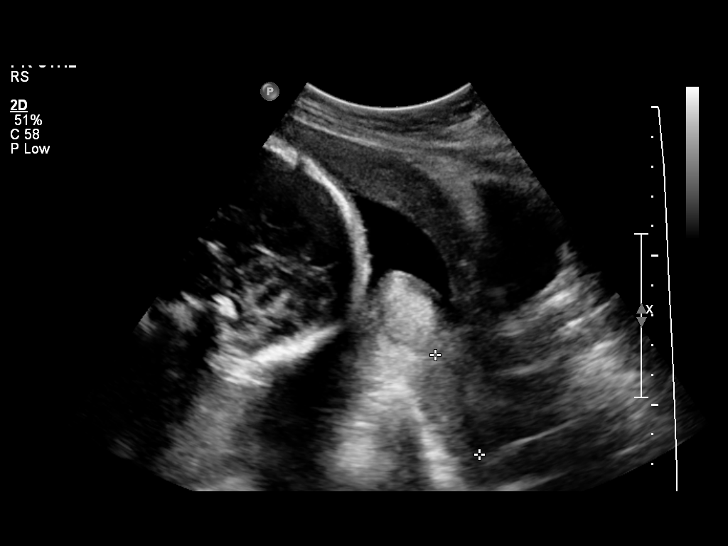
[im 12/87]
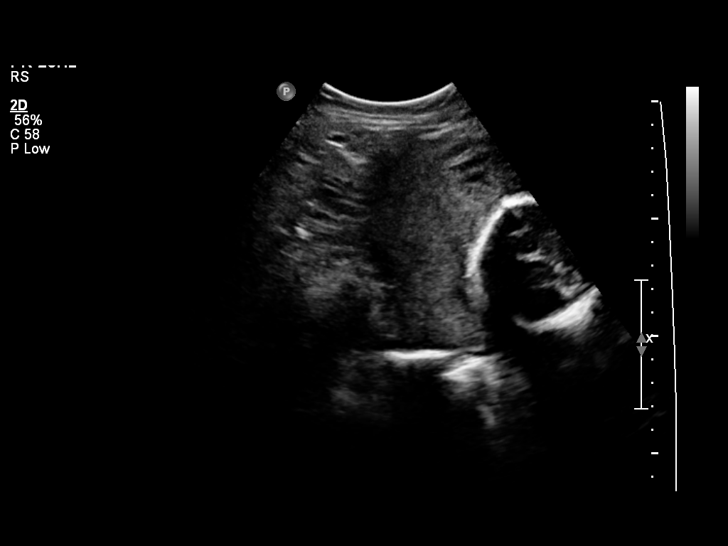
[im 19/87]
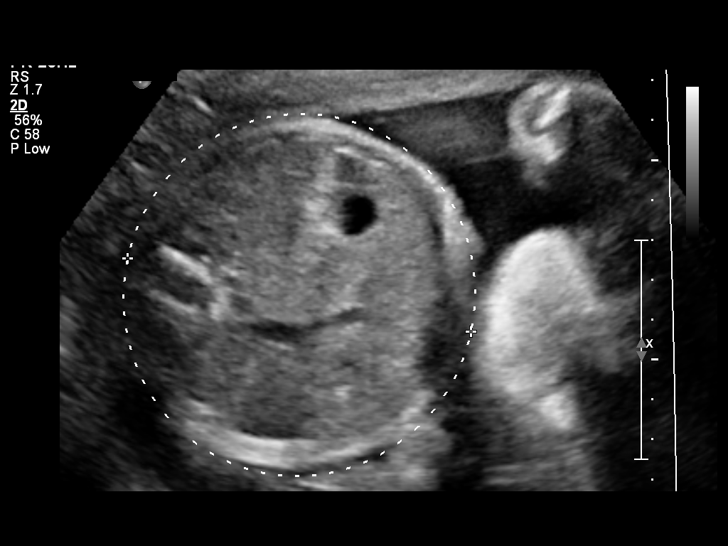
[im 30/87]
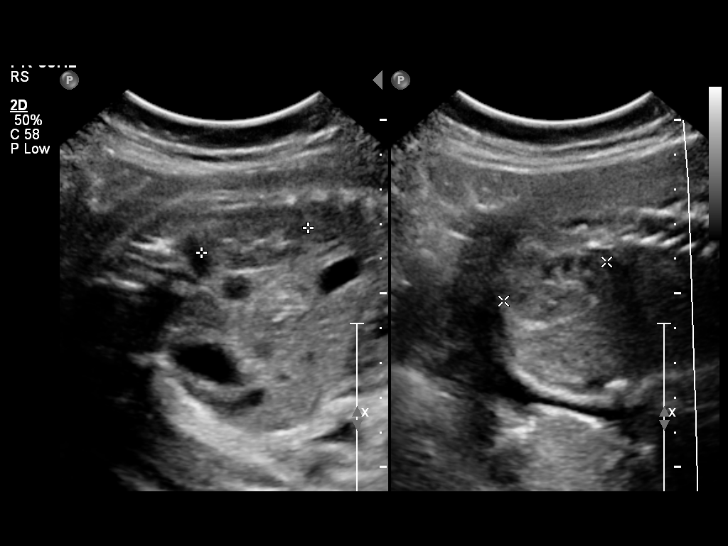
[im 38/87]
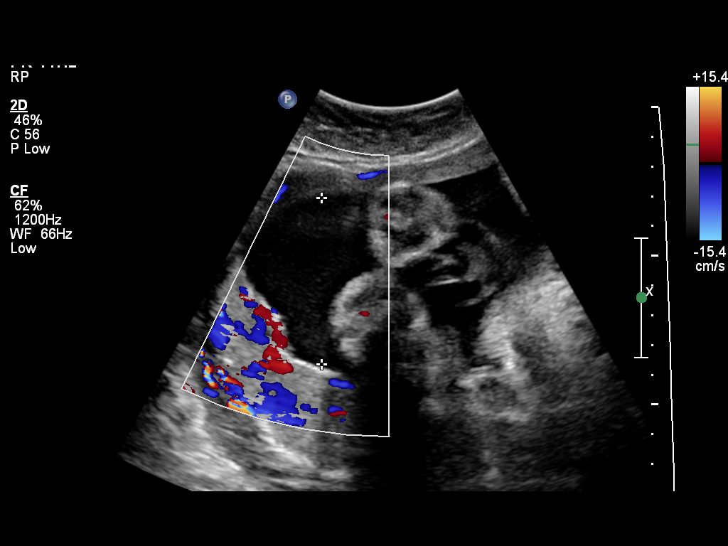
[im 45/87]
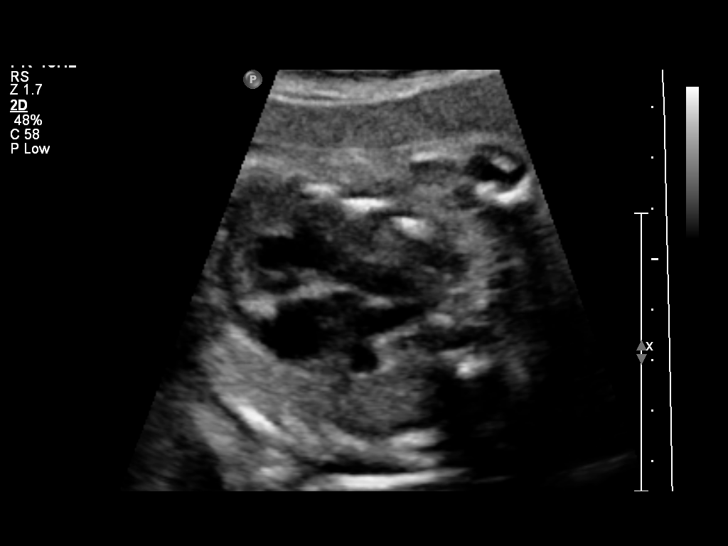
[im 57/87]
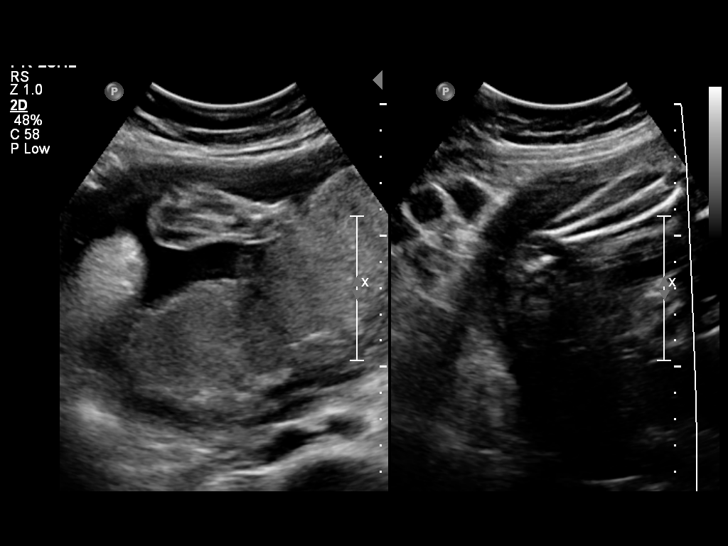
[im 64/87]
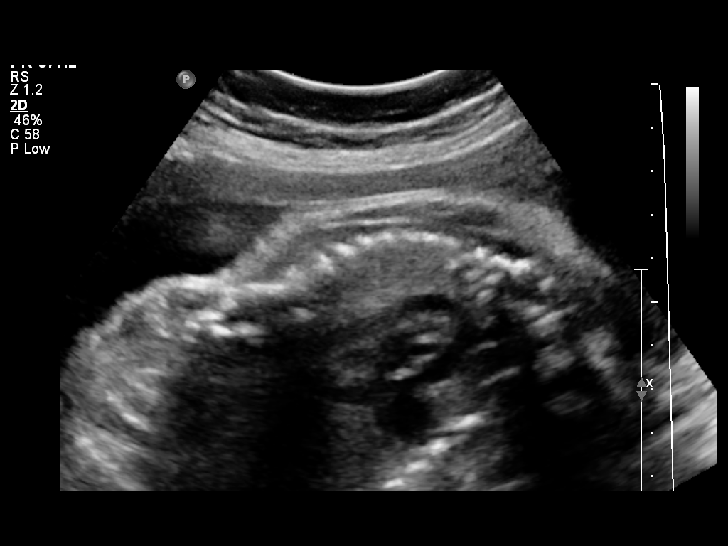
[im 72/87]
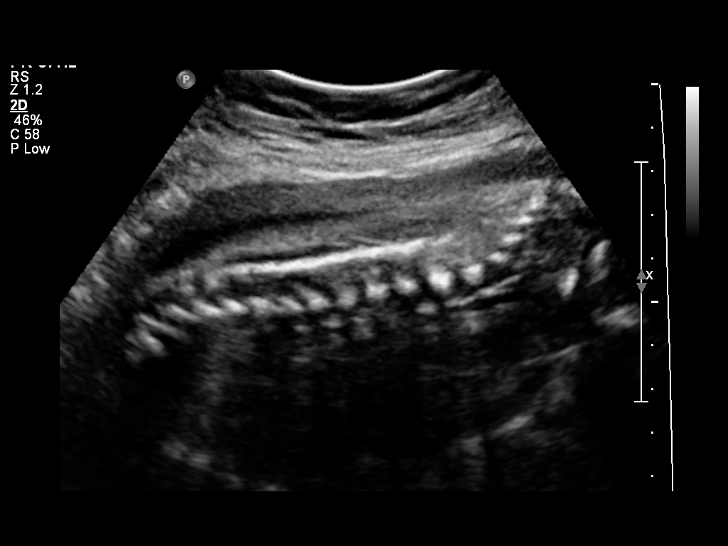
[im 83/87]
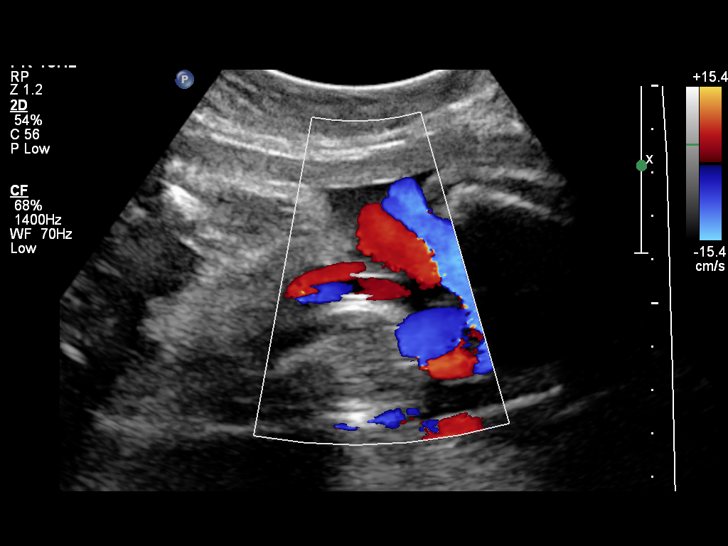

[Series 1: us ob detail +14 wk · 2 of 13 slices shown (2 of 2)]
[im 1/13]
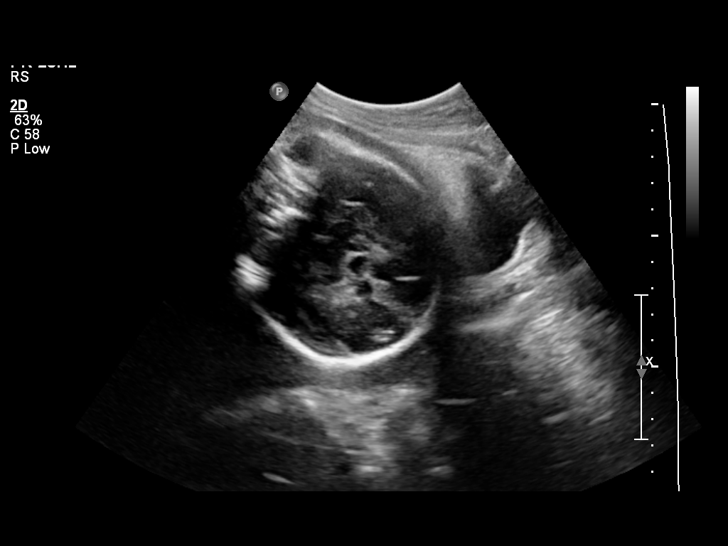
[im 9/13]
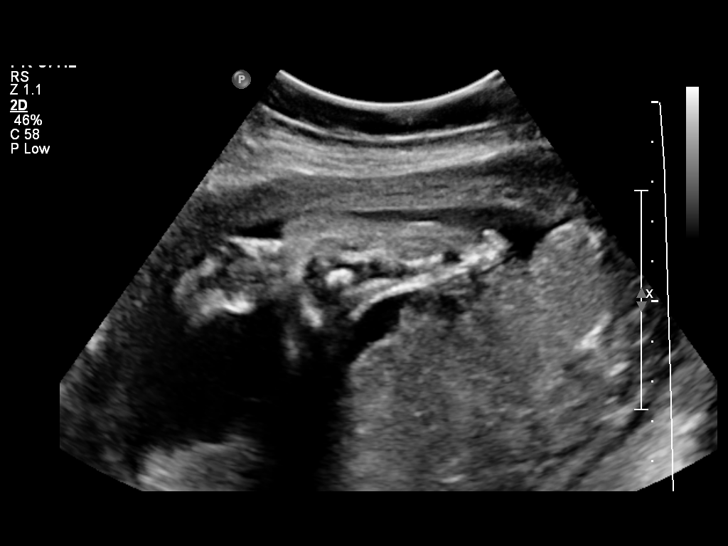

[12 of 28 positions shown; findings below may reference images not displayed]

OBSTETRICS REPORT
                      (Signed Final 02/13/2011 [DATE])

 Order#:         [id number]
Procedures

 US OB DETAIL + 14 WK                                  76811.0
Indications

 Detailed fetal anatomic survey
 No or Little Prenatal Care
Fetal Evaluation

 Fetal Heart Rate:  136                          bpm
 Cardiac Activity:  Observed
 Presentation:      Cephalic
 Placenta:          Posterior, above cervical
                    os
 P. Cord            Visualized
 Insertion:

 Amniotic Fluid
 AFI FV:      Subjectively within normal limits
 AFI Sum:     15.98   cm       58  %Tile     Larg Pckt:    5.57  cm
 RUQ:   5.57    cm   RLQ:    3.84   cm    LUQ:   3.81    cm   LLQ:    2.76   cm
Biometry

 BPD:       73  mm     G. Age:  29w 2d                CI:        70.02   70 - 86
                                                      FL/HC:      20.6   19.2 -

 HC:     278.3  mm     G. Age:  30w 3d       18  %    HC/AC:      1.01   0.99 -

 AC:     276.5  mm     G. Age:  31w 5d       80  %    FL/BPD:     78.4   71 - 87
 FL:      57.2  mm     G. Age:  30w 0d       24  %    FL/AC:      20.7   20 - 24
 HUM:     51.5  mm     G. Age:  30w 1d       46  %

 Est. FW:    4580  gm    3 lb 10 oz      62  %
Gestational Age

 LMP:           33w 1d        Date:  06/26/10                 EDD:   04/02/11
 U/S Today:     30w 3d                                        EDD:   04/21/11
 Best:          30w 3d     Det. By:  U/S (02/13/11)           EDD:   04/21/11
Anatomy
 Cranium:           Appears normal      Aortic Arch:       Appears normal
 Fetal Cavum:       Appears normal      Ductal Arch:       Not well
                                                           visualized
 Ventricles:        Appears normal      Diaphragm:         Appears normal
 Choroid Plexus:    Appears normal      Stomach:           Appears normal
 Cerebellum:        Not well            Abdomen:           Appears normal
                    visualized
 Posterior Fossa:   Not well            Abdominal Wall:    Not well
                    visualized                             visualized
 Nuchal Fold:       Appears normal      Cord Vessels:      Appears normal
                    (neck, nuchal                          (3 vessel cord)
                    fold)
 Face:              Appears normal      Kidneys:           Appear normal
                    (lips/profile/orbit
                    s)
 Heart:             Appears normal      Bladder:           Appears normal
                    (4 chamber &
                    axis)
 RVOT:              Appears normal      Spine:             Appears normal
 LVOT:              Appears normal      Limbs:             Four extremities
                                                           seen

 Other:     Fetus appears to be a female. 5th digit visualized. Rt
            heel visualized.  Technically difficult due to advanced GA.
Cervix Uterus Adnexa

 Cervix:       Not visualized (advanced GA >29 wks)
 Cul De Sac:   No free fluid seen.
 Left Ovary:    Not visualized.
 Right Ovary:   Not visualized.

 Adnexa:     No abnormality visualized.
Impression

 Single living IUP with US Gest. Age of 30w 3d. This is behind
 LMP; recommend assigned US EDD of 04/21/2011, and
 follow-up US in 3-4 wks to confirm linear fetal growth.
 Suboptimal visualization of anatomy due to advanced GA,
 however no fetal anomalies identified.
 Amniotic fluid within normal limits, with AFI of 15.98 cm.

 questions or concerns.

## 2012-11-28 IMAGING — US US OB FOLLOW-UP
1 series · 12 of 28 positions shown · non-contrast
Comparison: none

[Series 1: us ob follow up · 12 of 42 slices shown]
[im 2/42]
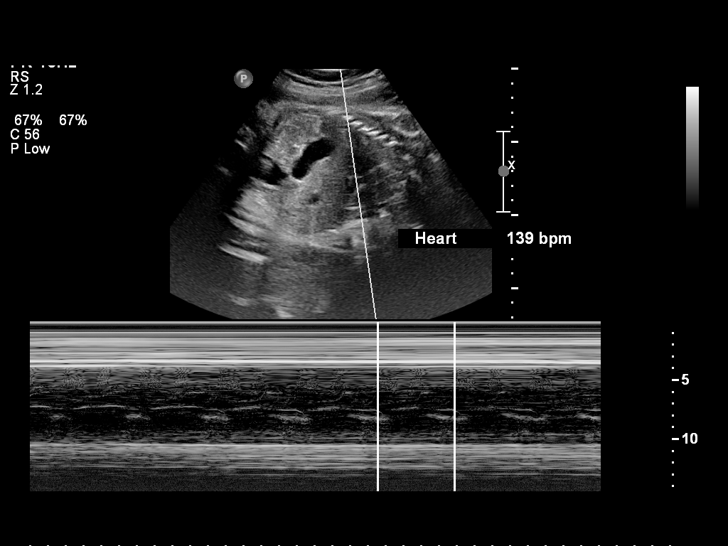
[im 5/42]
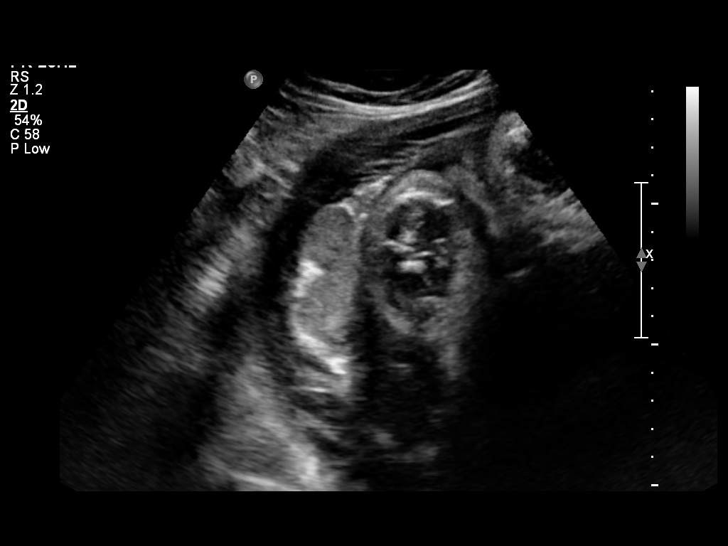
[im 8/42]
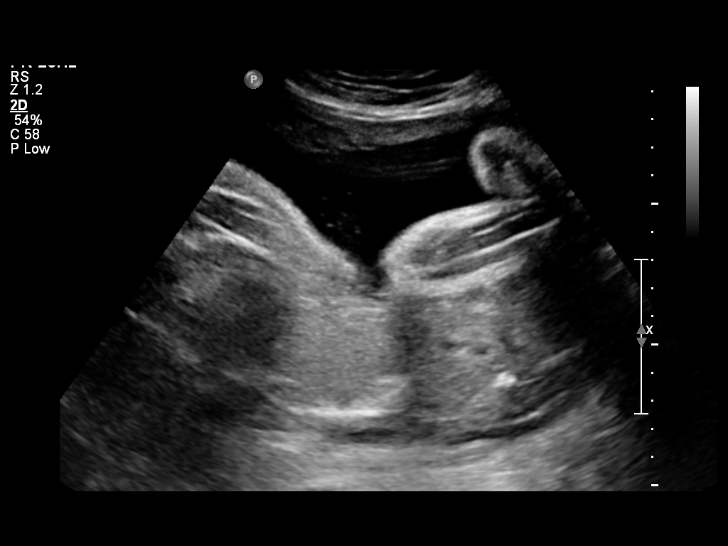
[im 13/42]
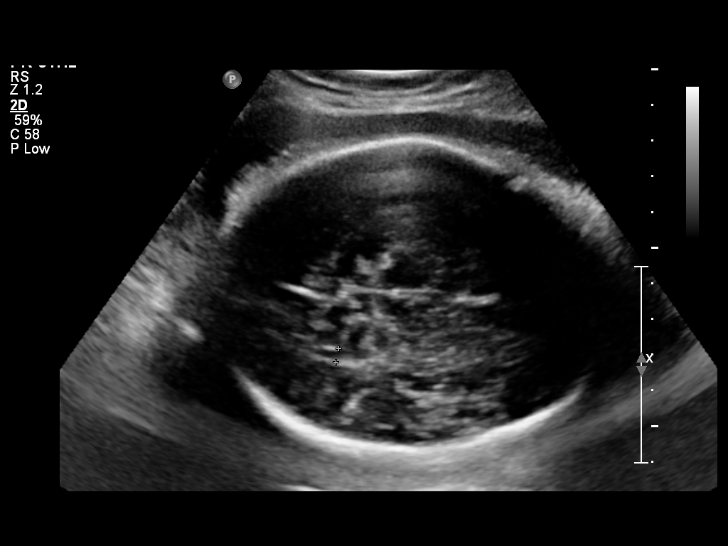
[im 16/42]
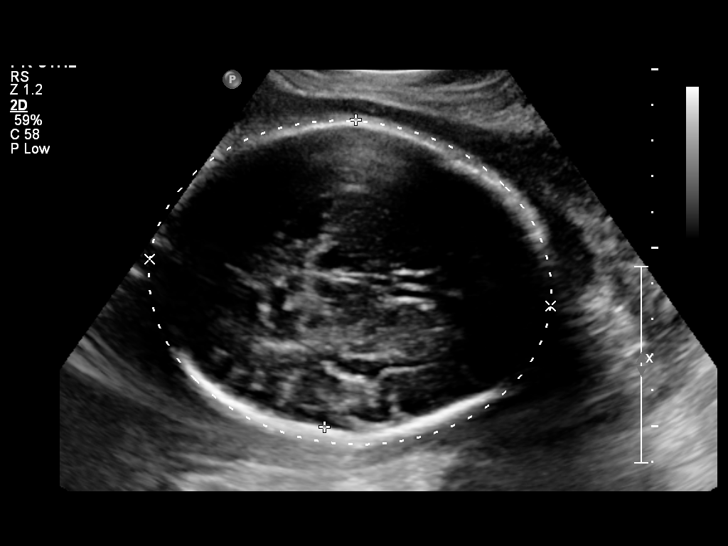
[im 19/42]
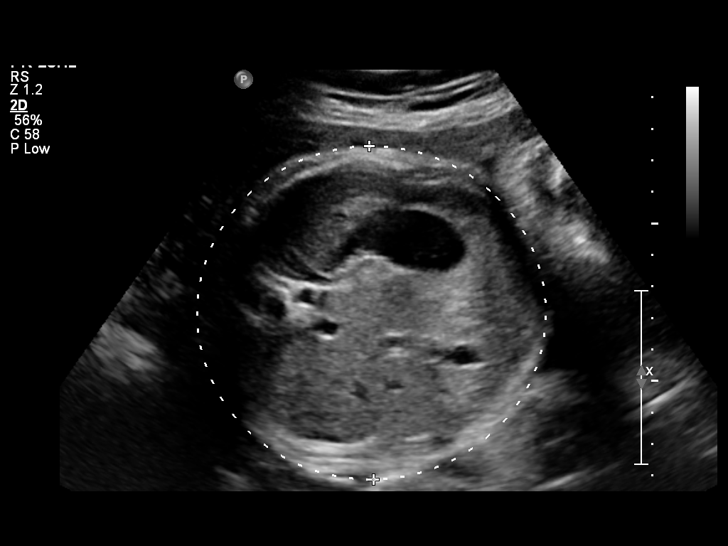
[im 23/42]
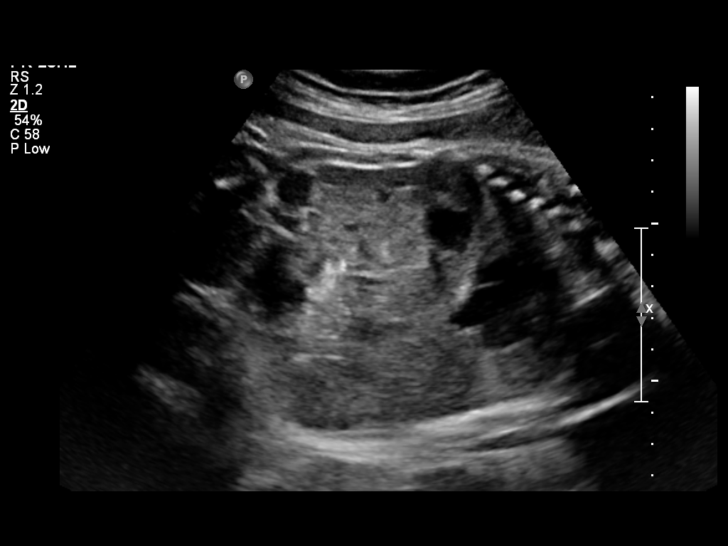
[im 26/42]
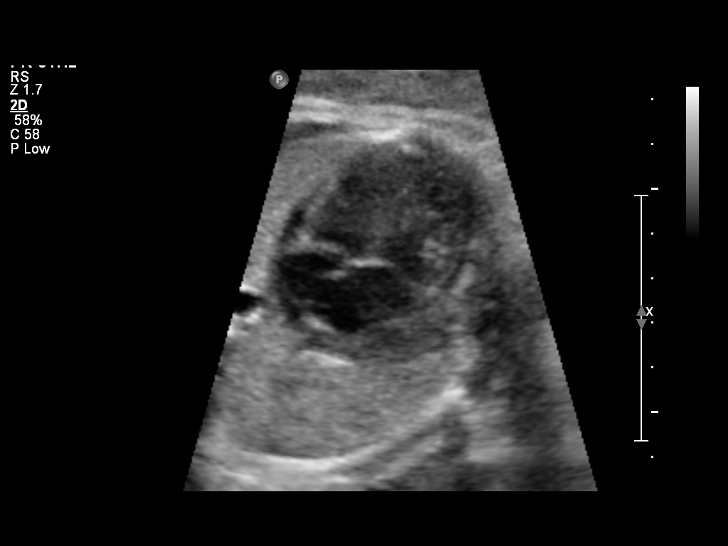
[im 29/42]
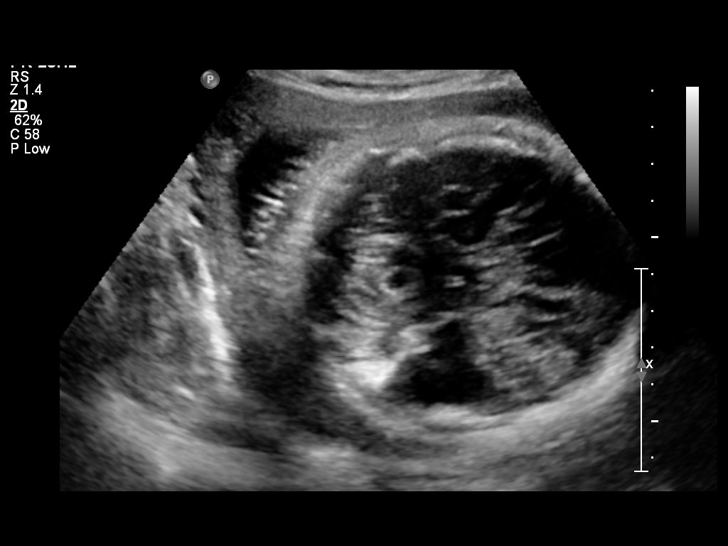
[im 34/42]
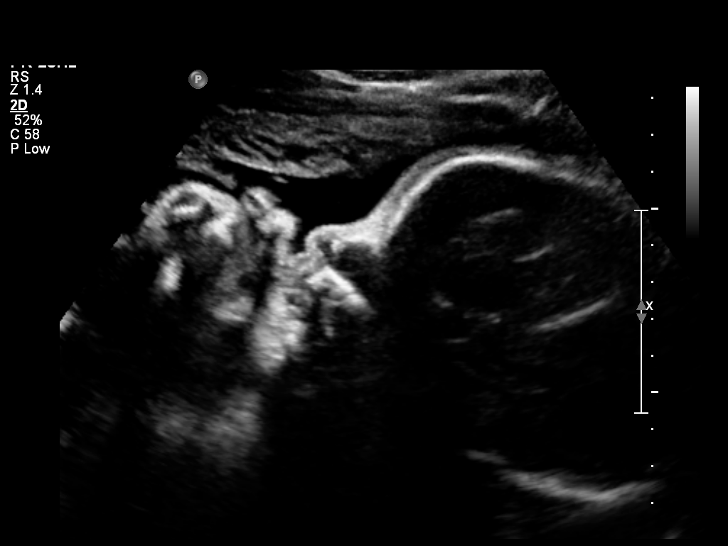
[im 37/42]
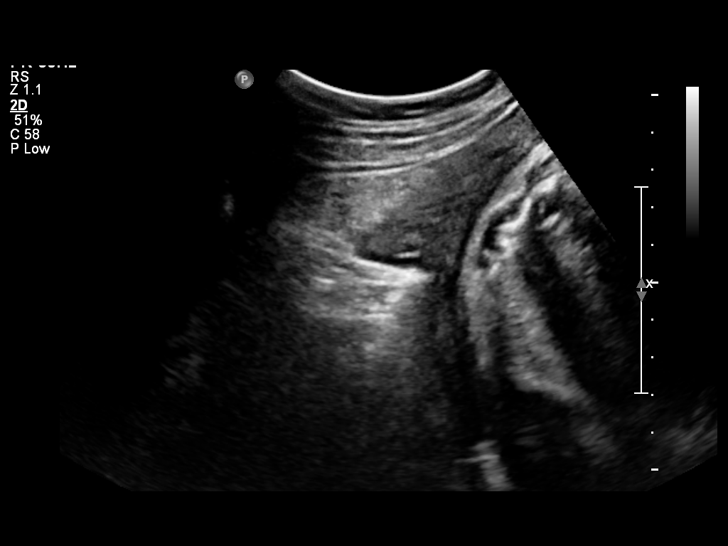
[im 40/42]
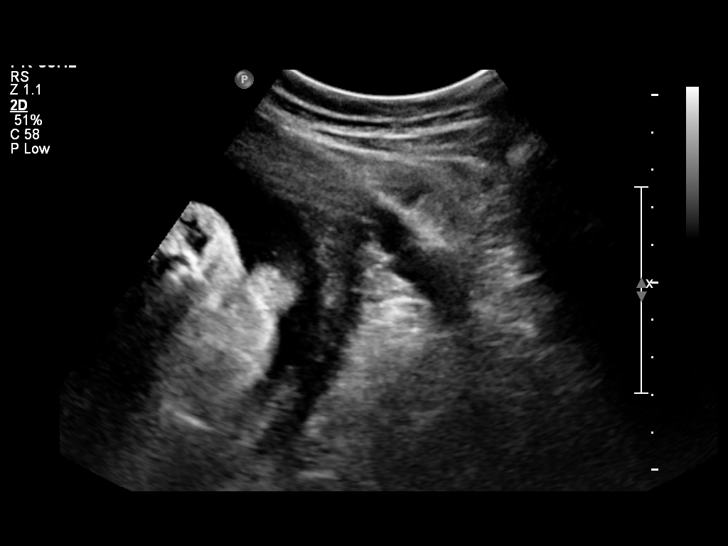

[12 of 28 positions shown; findings below may reference images not displayed]

OBSTETRICS REPORT
                      (Signed Final 03/31/2011 [DATE])

                 PA
 Order#:         [id number]
Procedures

 US OB FOLLOW UP                                       76816.1
Indications

 No or Little Prenatal Care
 Assess Fetal Growth / Estimated Fetal Weight
 Size greater than dates (Large for gestational [AGE]
Fetal Evaluation

 Fetal Heart Rate:  139                         bpm
 Cardiac Activity:  Observed
 Presentation:      Cephalic
 Placenta:          Posterior, above cervical
                    os
 P. Cord            Previously Visualized
 Insertion:

 Amniotic Fluid
 AFI FV:      Subjectively within normal limits
 AFI Sum:     13.9    cm      52   %Tile     Larg Pckt:   4.88   cm
 RUQ:   2.95   cm    RLQ:    4.88   cm    LUQ:   1.55    cm   LLQ:    4.52   cm
Biometry

 BPD:     86.1  mm    G. Age:   34w 5d                CI:        73.34   70 - 86
                                                      FL/HC:      21.1   20.8 -

 HC:     319.5  mm    G. Age:   36w 0d        9  %    HC/AC:      0.95   0.92 -

 AC:     336.1  mm    G. Age:   37w 4d       77  %    FL/BPD:     78.3   71 - 87
 FL:      67.4  mm    G. Age:   34w 5d        6  %    FL/AC:      20.1   20 - 24

 Est. FW:    4822  gm      6 lb 7 oz     54  %
Gestational Age

 LMP:           39w 5d       Date:   06/26/10                 EDD:   04/02/11
 U/S Today:     35w 5d                                        EDD:   04/30/11
 Best:          37w 0d    Det. By:   U/S (02/13/11)           EDD:   04/21/11
Anatomy

 Cranium:           Appears normal      Aortic Arch:       Previously seen
 Fetal Cavum:       Previously seen     Ductal Arch:       Not well
                                                           visualized
 Ventricles:        Appears normal      Diaphragm:         Appears normal
 Choroid Plexus:    Previously seen     Stomach:           Appears normal
 Cerebellum:        Appears normal      Abdomen:           Previously seen
 Posterior Fossa:   Appears normal      Abdominal Wall:    Not well
                                                           visualized
 Nuchal Fold:       Not applicable      Cord Vessels:      Previously seen
                    (>20 wks GA)
 Face:              Previously seen     Kidneys:           Appear normal
 Heart:             Appears normal      Bladder:           Appears normal
                    (4 chamber &
                    axis)
 RVOT:              Previously seen     Spine:             Previously seen
 LVOT:              Previously seen     Limbs:             Four extremities
                                                           previously seen

 Other:     Female gender previously seen. 5th digit, and Rt heel
            previously seen.  Technically difficult due to advanced
            GA.
Targeted Anatomy

 Fetal Central Nervous System
 Lat. Ventricles:
Cervix Uterus Adnexa

 Cervix:       Not visualized (advanced GA >34 wks)
 Left Ovary:   Not visualized.
 Right Ovary:  Within normal limits.

 Adnexa:     No abnormality visualized.
Impression

   Single living intrauterine gestation in cephalic presentation
 with concordant gestational age and fetal indices. The EFW
 today is at the  54th  percentile. Normal amniotic fluid volume.

 questions or concerns.

## 2013-12-06 ENCOUNTER — Encounter (HOSPITAL_COMMUNITY): Payer: Self-pay | Admitting: Emergency Medicine

## 2013-12-06 ENCOUNTER — Emergency Department (HOSPITAL_COMMUNITY)
Admission: EM | Admit: 2013-12-06 | Discharge: 2013-12-06 | Discharge status: Against Medical Advice | Payer: Medicaid Other | Attending: Emergency Medicine | Admitting: Emergency Medicine

## 2013-12-06 DIAGNOSIS — N898 Other specified noninflammatory disorders of vagina: Secondary | ICD-10-CM | POA: Insufficient documentation

## 2013-12-06 DIAGNOSIS — M545 Low back pain, unspecified: Secondary | ICD-10-CM | POA: Insufficient documentation

## 2013-12-06 DIAGNOSIS — R51 Headache: Secondary | ICD-10-CM | POA: Insufficient documentation

## 2013-12-06 NOTE — ED Notes (Signed)
Pt reports heavy vaginal bleeding x 1 month with dark blood and large clots. Now having headache and lower back pain, no relief with otc meds. No acute distress noted at triage.

## 2014-02-26 ENCOUNTER — Encounter (HOSPITAL_COMMUNITY): Payer: Self-pay | Admitting: Emergency Medicine

## 2016-07-08 ENCOUNTER — Ambulatory Visit (HOSPITAL_COMMUNITY)
Admission: EM | Admit: 2016-07-08 | Discharge: 2016-07-08 | Disposition: A | Discharge status: Home / Self Care | Payer: Medicaid Other | Attending: Internal Medicine | Admitting: Internal Medicine

## 2016-07-08 ENCOUNTER — Encounter (HOSPITAL_COMMUNITY): Payer: Self-pay | Admitting: *Deleted

## 2016-07-08 DIAGNOSIS — L298 Other pruritus: Secondary | ICD-10-CM | POA: Insufficient documentation

## 2016-07-08 DIAGNOSIS — F329 Major depressive disorder, single episode, unspecified: Secondary | ICD-10-CM | POA: Insufficient documentation

## 2016-07-08 DIAGNOSIS — B9689 Other specified bacterial agents as the cause of diseases classified elsewhere: Secondary | ICD-10-CM

## 2016-07-08 DIAGNOSIS — N760 Acute vaginitis: Secondary | ICD-10-CM | POA: Diagnosis not present

## 2016-07-08 DIAGNOSIS — Z1389 Encounter for screening for other disorder: Secondary | ICD-10-CM | POA: Diagnosis not present

## 2016-07-08 DIAGNOSIS — Z3202 Encounter for pregnancy test, result negative: Secondary | ICD-10-CM

## 2016-07-08 DIAGNOSIS — Z87891 Personal history of nicotine dependence: Secondary | ICD-10-CM | POA: Diagnosis not present

## 2016-07-08 DIAGNOSIS — N898 Other specified noninflammatory disorders of vagina: Secondary | ICD-10-CM | POA: Insufficient documentation

## 2016-07-08 DIAGNOSIS — B3731 Acute candidiasis of vulva and vagina: Secondary | ICD-10-CM

## 2016-07-08 DIAGNOSIS — B373 Candidiasis of vulva and vagina: Secondary | ICD-10-CM | POA: Diagnosis not present

## 2016-07-08 LAB — POCT URINALYSIS DIP (DEVICE)
BILIRUBIN URINE: NEGATIVE
GLUCOSE, UA: NEGATIVE mg/dL
Hgb urine dipstick: NEGATIVE
KETONES UR: NEGATIVE mg/dL
Leukocytes, UA: NEGATIVE
Nitrite: NEGATIVE
Protein, ur: NEGATIVE mg/dL
Specific Gravity, Urine: 1.025 (ref 1.005–1.030)
Urobilinogen, UA: 0.2 mg/dL (ref 0.0–1.0)
pH: 6 (ref 5.0–8.0)

## 2016-07-08 LAB — POCT PREGNANCY, URINE: PREG TEST UR: NEGATIVE

## 2016-07-08 MED ORDER — METRONIDAZOLE 500 MG PO TABS: 500.0000 mg | ORAL_TABLET | Freq: Two times a day (BID) | ORAL | 0 refills | Status: AC

## 2016-07-08 MED ORDER — FLUCONAZOLE 200 MG PO TABS: ORAL_TABLET | ORAL | 0 refills | Status: AC

## 2016-07-08 NOTE — Discharge Instructions (Signed)
You're being tested today for gonorrhea, chlamydia, trichomoniasis, Gardnerella, and candidiasis. I prescribed metronidazole, take one tablet twice a day for 7 days. Do not drink any alcohol while taking is making very ill. I also prescribed Diflucan, take one tablet today wait 3 days and then take a second tablet as this covers for yeast.  You'll be contacted in 3-5 business days with the results of your tests. Should you be positive for anything that was not covered in your visit today you'll be notified and given instructions as to what to do. If your symptoms persist follow up with Public health, your primary care provider, or return to clinic as needed.

## 2016-07-08 NOTE — ED Provider Notes (Signed)
CSN: [id number]     Arrival date & time 07/08/16  1011 History   None    Chief Complaint  Patient presents with  . Vaginal Discharge   (Consider location/radiation/quality/duration/timing/severity/associated sxs/prior Treatment) 41 year old female presents for evaluation for vaginal discharge and itching. She has had her symptoms for approximately 2 days. She is also complaining of a foul odor as well. She denies any weakness, dizziness, fever, chills, any swollen lymph nodes, any nausea, vomiting, diarrhea, or abdominal pain, she denies any flank pain, denies any dysuria, frequency, or urgency. She's had no chest pain or chest tightness, no shortness of breath, no swelling in her hands, feet, or ankles. She is sexually active, with 1 partner, with intermittent condom use. She's had no pain with intercourse. Her last menstrual cycle was 06/21/2016, she has had no vaginal bleeding, or spotting. Family history, social history, surgical history were all reviewed and nonpertinent   The history is provided by the patient.    Past Medical History:  Diagnosis Date  . Abnormal Pap smear   . Anemia   . Bacterial vaginosis 01/24/2011  . Chlamydia 01/24/2011  . Depression   . No pertinent past medical history    Past Surgical History:  Procedure Laterality Date  . NO PAST SURGERIES     Family History  Problem Relation Age of Onset  . Cancer Mother    Social History  Substance Use Topics  . Smoking status: Former Smoker    Packs/day: 0.50    Years: 3.00    Types: Cigarettes    Quit date: 04/13/2008  . Smokeless tobacco: Never Used  . Alcohol use No     Comment: smoked marijuana 2 wks ago   OB History    Gravida Para Term Preterm AB Living   9 9 7 2  0 8   SAB TAB Ectopic Multiple Live Births   0 0 0 0 1     Review of Systems  Reason unable to perform ROS: As covered in history of present illness.  All other systems reviewed and are negative.   Allergies  Patient has no known  allergies.  Home Medications   Prior to Admission medications   Medication Sig Start Date End Date Taking? Authorizing Provider  acetaminophen (TYLENOL) 500 MG tablet Take 500 mg by mouth every 6 (six) hours as needed. For pain     Historical Provider, MD  fluconazole (DIFLUCAN) 200 MG tablet Take 1 tablet now wait 3 days and take the second tablet 07/08/16   Dorena Bodo, NP  metroNIDAZOLE (FLAGYL) 500 MG tablet Take 1 tablet (500 mg total) by mouth 2 (two) times daily. 07/08/16   Dorena Bodo, NP   Meds Ordered and Administered this Visit  Medications - No data to display  BP 138/78 (BP Location: Right Arm)   Pulse 92   Temp 98.4 F (36.9 C) (Oral)   Resp 18   LMP 06/21/2016   SpO2 100%   Breastfeeding? No  No data found.   Physical Exam  Constitutional: She is oriented to person, place, and time. She appears well-developed and well-nourished. No distress.  HENT:  Head: Normocephalic and atraumatic.  Cardiovascular: Normal rate and regular rhythm.   Pulmonary/Chest: Effort normal and breath sounds normal.  Abdominal: Soft. Bowel sounds are normal. She exhibits no distension. There is no tenderness. There is no guarding. Hernia confirmed negative in the right inguinal area and confirmed negative in the left inguinal area.  Genitourinary: Vagina normal and uterus normal.  Pelvic exam was performed with patient supine. There is no rash, tenderness, lesion or injury on the right labia. There is no rash, tenderness, lesion or injury on the left labia. Cervix exhibits discharge. Cervix exhibits no motion tenderness and no friability. Right adnexum displays no mass, no tenderness and no fullness. Left adnexum displays no mass and no tenderness. No erythema or tenderness in the vagina. No foreign body in the vagina. No signs of injury around the vagina. No vaginal discharge found.  Genitourinary Comments: Copious mucopurulent discharge, along with odor, any discharge with a chunky-like  consistency  Musculoskeletal: She exhibits no edema or tenderness.  Lymphadenopathy:       Right: No inguinal adenopathy present.       Left: No inguinal adenopathy present.  Neurological: She is alert and oriented to person, place, and time.  Skin: Skin is warm and dry. Capillary refill takes less than 2 seconds. She is not diaphoretic.  Psychiatric: She has a normal mood and affect. Her behavior is normal.  Nursing note and vitals reviewed.   Urgent Care Course     Procedures (including critical care time)  Labs Review Labs Reviewed  POCT URINALYSIS DIP (DEVICE)  POCT PREGNANCY, URINE  CERVICOVAGINAL ANCILLARY ONLY    Imaging Review No results found.      MDM   1. BV (bacterial vaginosis)   2. Yeast vaginitis    Cytology samples were obtained and sent to lab. UA was negative for signs of infection, patient's pregnancy test negative. Patient declined to have treatment for STI's until lab results were returned, she did consent to treatment for BV and yeast candidiasis. Prescription sent to the pharmacy for metronidazole and Diflucan      Dorena BodoLawrence Sara Selvidge, NP 07/08/16 1236

## 2016-07-08 NOTE — ED Triage Notes (Signed)
Pt here for vag d//c onset 2 days associated w/pain, foul odor, abd pain  Reports she is sexually active in a monogamous relationship and does not use condoms  Denies urinary sx, fevers, chills  A&O x4... NAD

## 2016-07-09 LAB — CERVICOVAGINAL ANCILLARY ONLY
CHLAMYDIA, DNA PROBE: NEGATIVE
NEISSERIA GONORRHEA: NEGATIVE
WET PREP (BD AFFIRM): POSITIVE — AB

## 2017-02-25 DEATH — deceased

## 2018-11-23 ENCOUNTER — Other Ambulatory Visit: Payer: Self-pay | Admitting: Oral Surgery

## 2020-07-13 ENCOUNTER — Other Ambulatory Visit: Payer: Self-pay

## 2020-07-13 ENCOUNTER — Encounter (HOSPITAL_COMMUNITY): Payer: Self-pay | Admitting: Emergency Medicine

## 2020-07-13 ENCOUNTER — Emergency Department (HOSPITAL_COMMUNITY)
Admission: EM | Admit: 2020-07-13 | Discharge: 2020-07-13 | Disposition: A | Discharge status: Home / Self Care | Payer: Medicaid Other | Attending: Emergency Medicine | Admitting: Emergency Medicine

## 2020-07-13 DIAGNOSIS — G43909 Migraine, unspecified, not intractable, without status migrainosus: Secondary | ICD-10-CM | POA: Diagnosis not present

## 2020-07-13 DIAGNOSIS — Z5321 Procedure and treatment not carried out due to patient leaving prior to being seen by health care provider: Secondary | ICD-10-CM | POA: Insufficient documentation

## 2020-07-13 NOTE — ED Notes (Signed)
Pt called for vitals with no response x1 

## 2020-07-13 NOTE — ED Triage Notes (Signed)
Patient reports migraine headache onset last week , no emesis or fever , alert and oriented , mild photophobia .

## 2020-07-13 NOTE — ED Notes (Signed)
Pt called for vitals x3 no response 

## 2024-02-29 NOTE — Congregational Nurse Program (Unsigned)
 Met with client who also has three children who will be coming later after she picks them up from school. Mercedes Best [phone]; Mercedes Best 01 [phone]; Mercedes Best [phone]. Client reports that she has been living in a hotel for past 12 months. Client reports that she and children all go to [facility]. Reports that they are all healthy with no medical or mental health needs and no one takes medication. Nurse explained her role and will follow up with client and family as needed.  Laikynn Pollio D. [facility] Congregational Nurse [employer] [phone]
